# Patient Record
Sex: Female | Born: 1985 | Hispanic: Yes | Marital: Married
Health system: Southern US, Community
[De-identification: ages and names within clinical notes are randomized; demographics above are authoritative.]

---

## 2007-04-19 ENCOUNTER — Ambulatory Visit: Payer: Self-pay | Admitting: Family Medicine

## 2007-06-03 ENCOUNTER — Ambulatory Visit: Payer: Self-pay | Admitting: Certified Nurse Midwife

## 2007-10-23 ENCOUNTER — Observation Stay: Payer: Self-pay

## 2007-10-26 ENCOUNTER — Observation Stay: Payer: Self-pay | Admitting: Certified Nurse Midwife

## 2007-10-26 ENCOUNTER — Inpatient Hospital Stay: Payer: Self-pay | Admitting: Certified Nurse Midwife

## 2008-09-02 ENCOUNTER — Ambulatory Visit: Payer: Self-pay | Admitting: Certified Nurse Midwife

## 2008-11-02 ENCOUNTER — Encounter: Payer: Self-pay | Admitting: Maternal & Fetal Medicine

## 2009-03-09 ENCOUNTER — Observation Stay: Payer: Self-pay | Admitting: Obstetrics and Gynecology

## 2009-03-13 ENCOUNTER — Inpatient Hospital Stay: Payer: Self-pay | Admitting: Obstetrics and Gynecology

## 2009-06-17 IMAGING — US US OB US >=[ID] SNGL FETUS
1 series · 17 of 28 positions shown · non-contrast
Comparison: none

REASON FOR EXAM: dates and antamony
COMMENTS:

[Series 1: us ob us >=(id) sngl fetus · 17 of 80 slices shown]
[im 1/80]
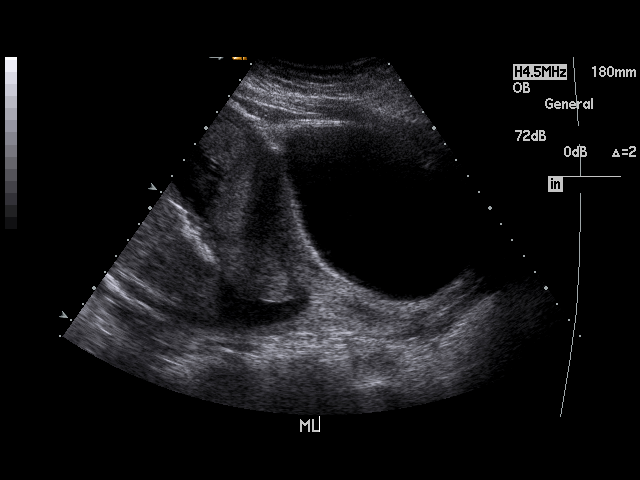
[im 6/80]
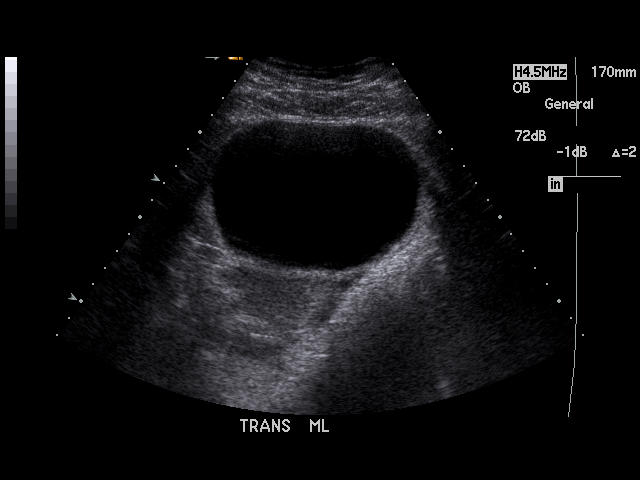
[im 12/80]
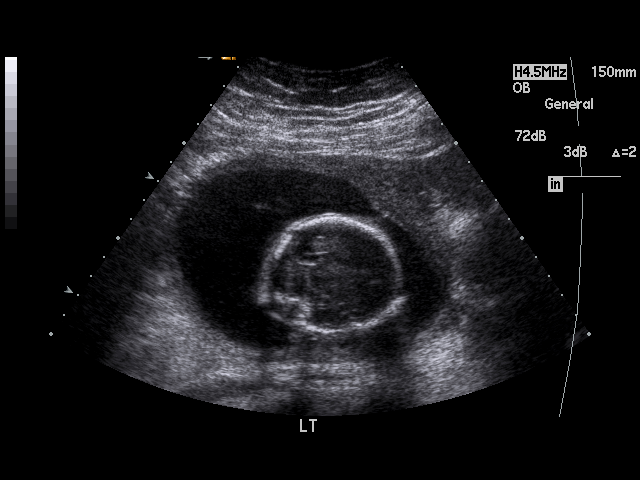
[im 15/80]
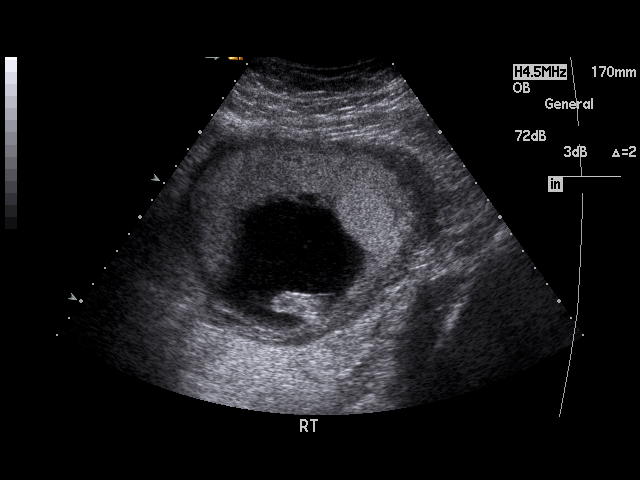
[im 21/80]
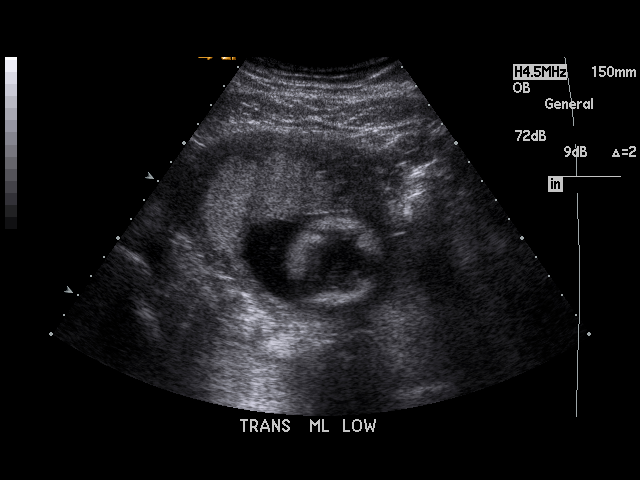
[im 27/80]
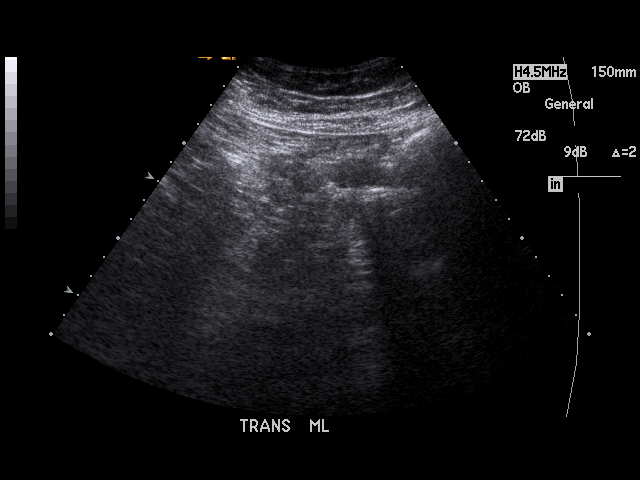
[im 30/80]
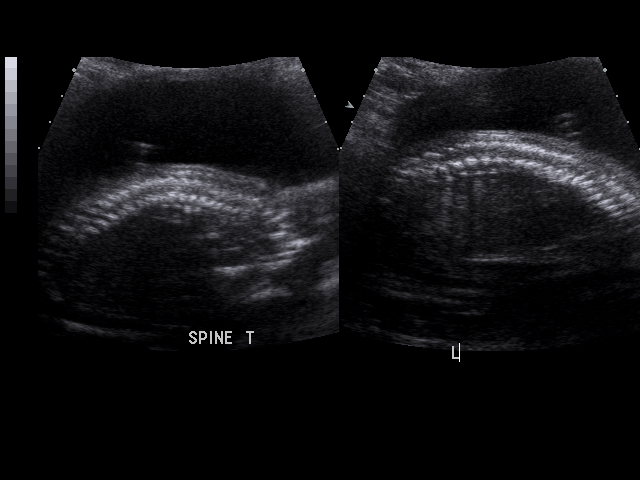
[im 36/80]
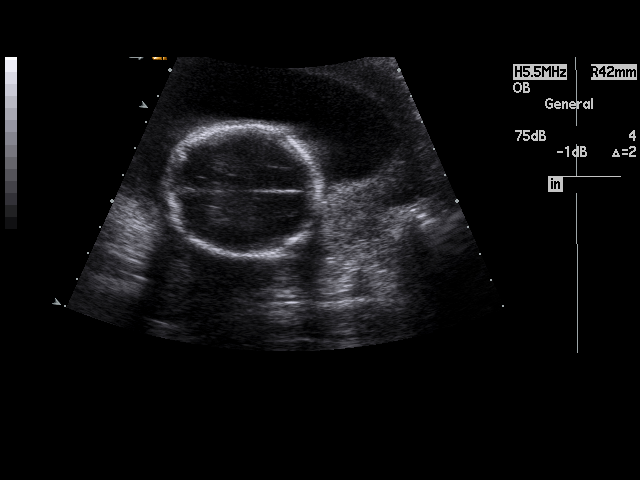
[im 41/80]
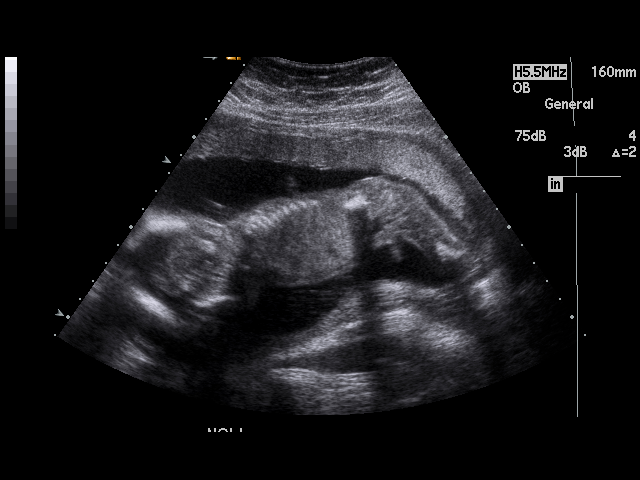
[im 44/80]
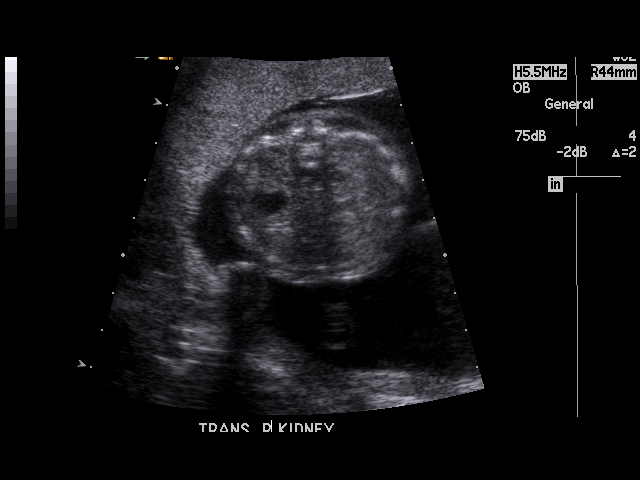
[im 50/80]
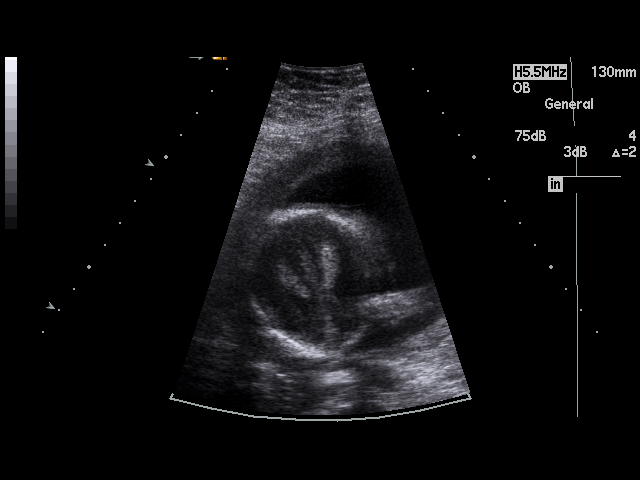
[im 53/80]
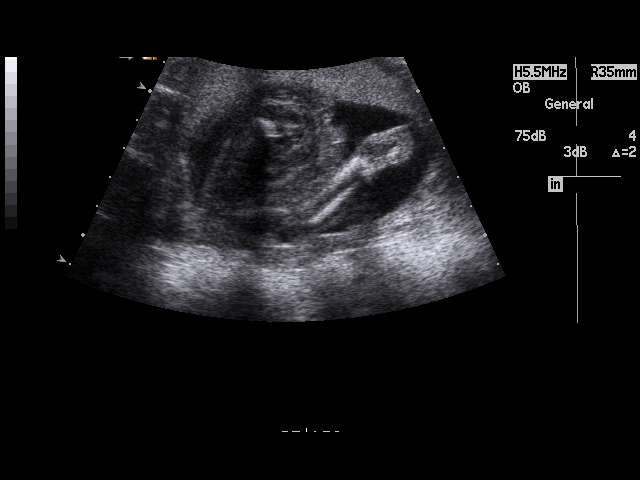
[im 59/80]
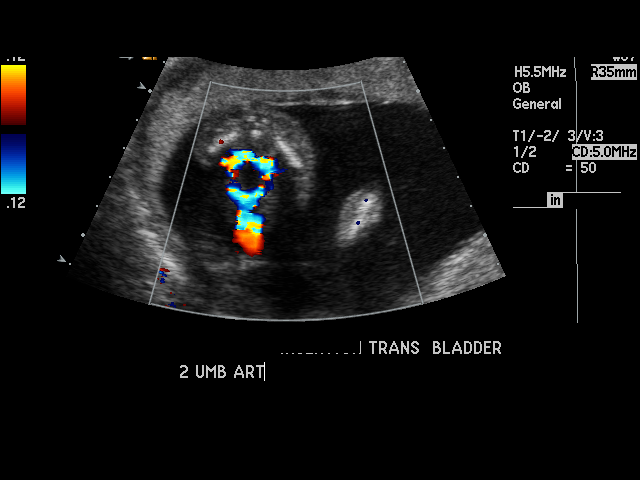
[im 65/80]
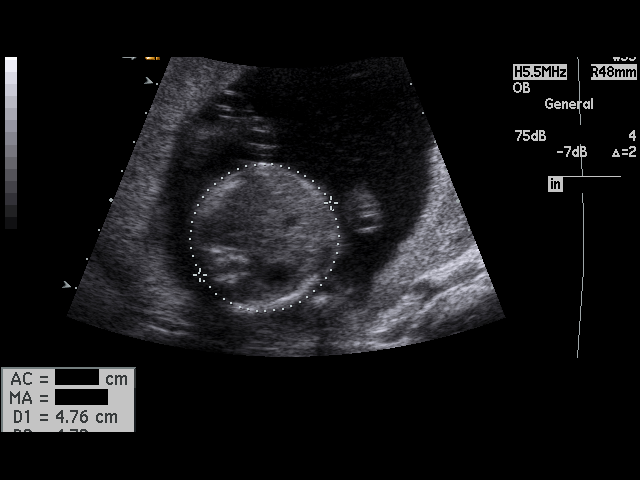
[im 68/80]
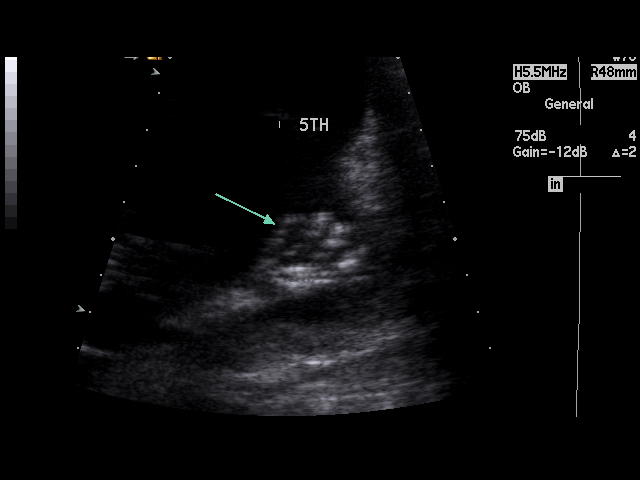
[im 74/80]
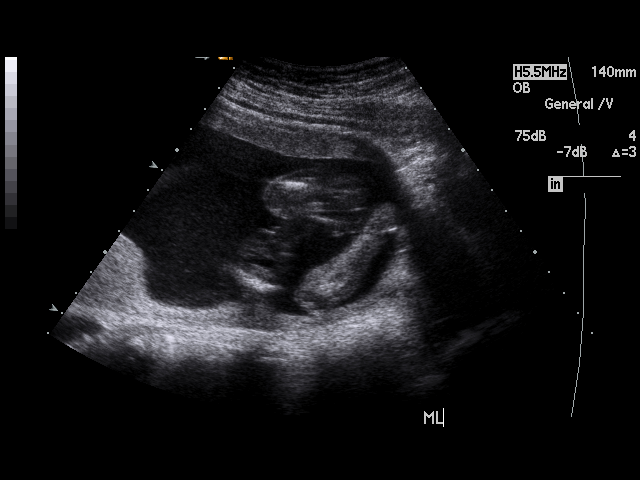
[im 80/80]
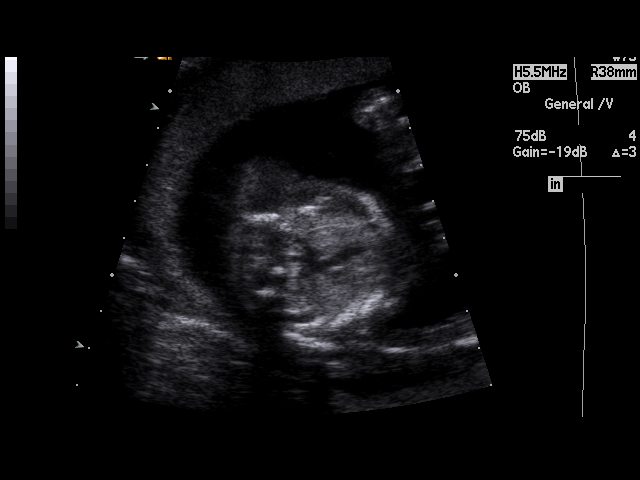

[17 of 28 positions shown; findings below may reference images not displayed]

PROCEDURE:     US  - US OB GREATER/OR EQUAL TO 30KMT  - June 03, 2007  [DATE]

RESULT:     There is observed a single living intrauterine gestation.
Presentation is variable during the course of this exam. The placenta is
anterior. The amniotic fluid volume appears normal. Fetal heart, stomach,
and urinary bladder are visualized. No hydronephrosis or hydrocephalus is
seen. Fetal measurements are as follows:

BPD      4.69 cm      20 weeks 1 day
HC           17.2 cm      19 weeks 5 days
AC         14.88 cm      20 weeks 1 day
FL              3.23 cm      20 weeks 0 days
HL           2.99 cm      19 weeks 6 days

EFW equals 356 grams. Averaged ultrasound age 20 weeks 0. Ultrasound EDD is
October 21, 2007.

Fetal heart rate was monitored at 160 beats per minute.
IMPRESSION: Please see above.

## 2010-03-08 ENCOUNTER — Ambulatory Visit: Payer: Self-pay | Admitting: Family Medicine

## 2010-08-02 ENCOUNTER — Inpatient Hospital Stay: Payer: Self-pay | Admitting: Obstetrics and Gynecology

## 2010-11-17 IMAGING — US US OB DETAIL+14 WK - NRPT [facility]
1 series · 14 of 28 positions shown · non-contrast
Comparison: none

[Series 1: us ob detail+14 wk - nrpt [facility] · 14 of 67 slices shown]
[im 3/67]
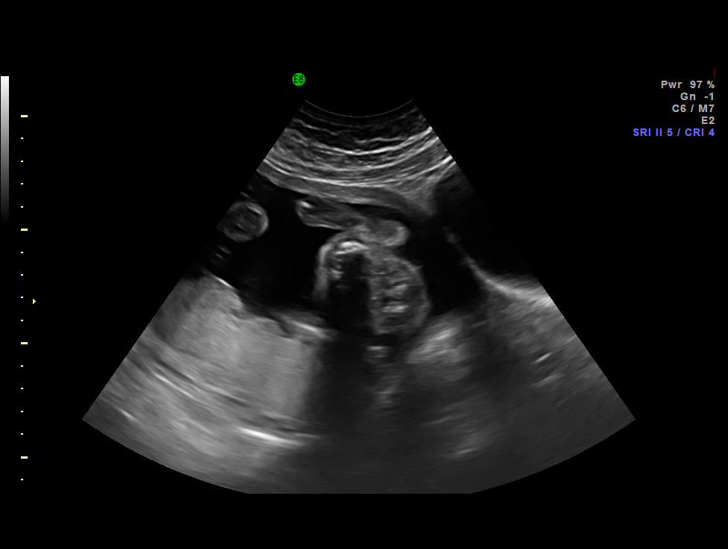
[im 8/67]
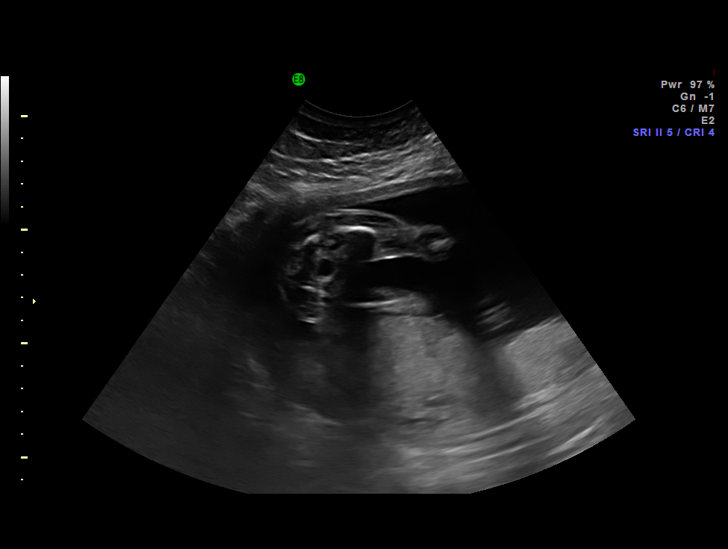
[im 13/67]
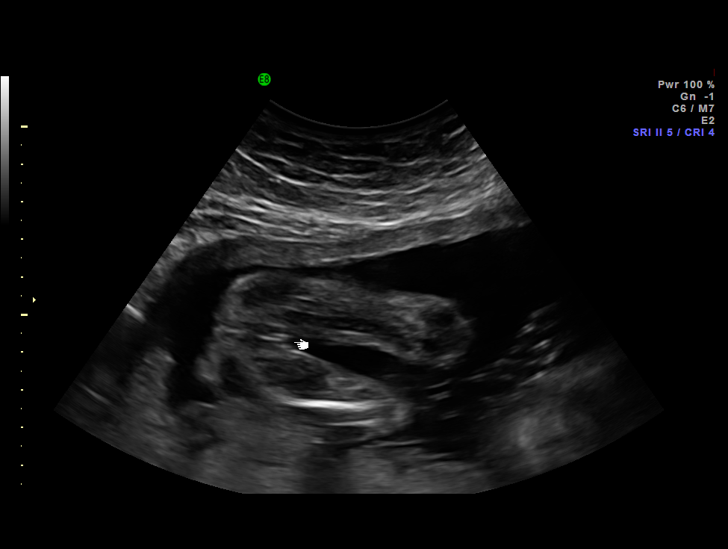
[im 18/67]
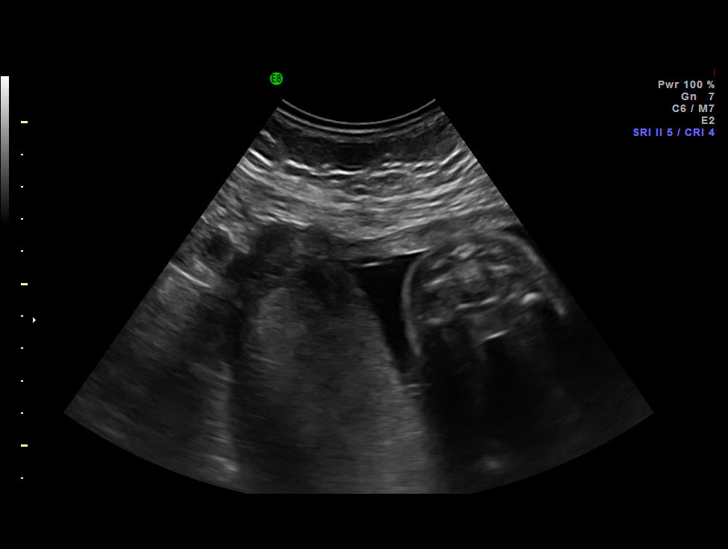
[im 23/67]
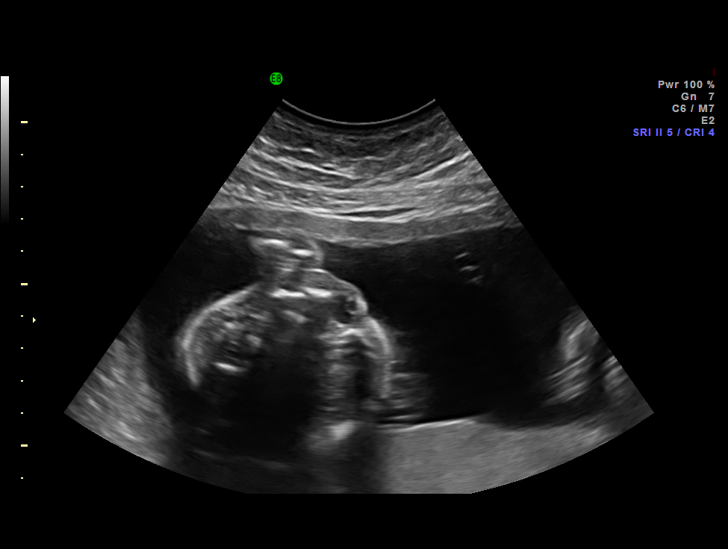
[im 27/67]
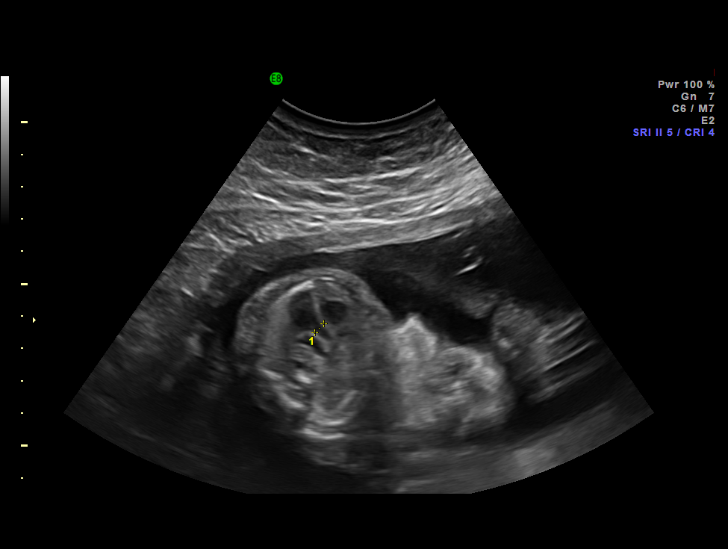
[im 32/67]
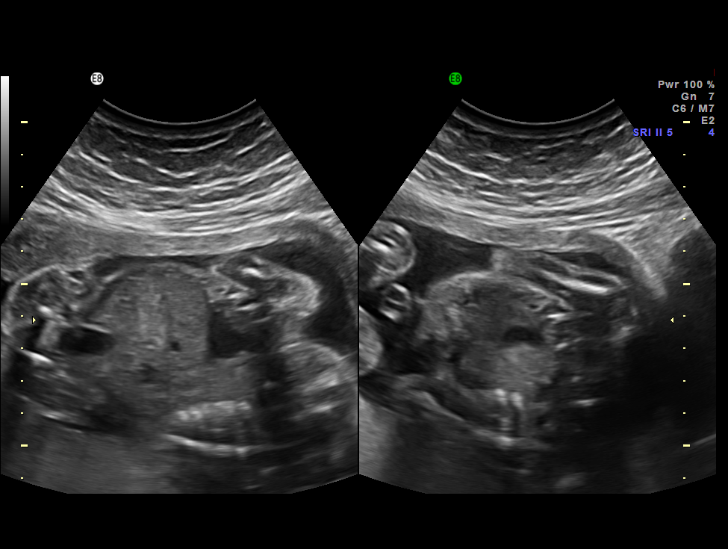
[im 37/67]
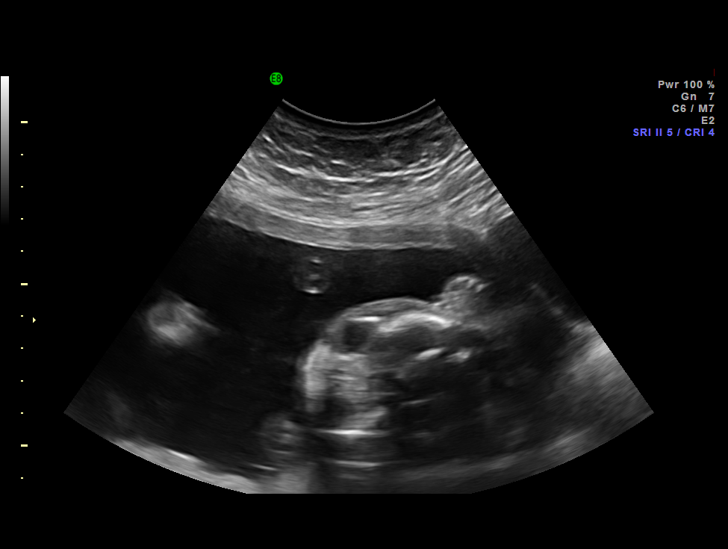
[im 42/67]
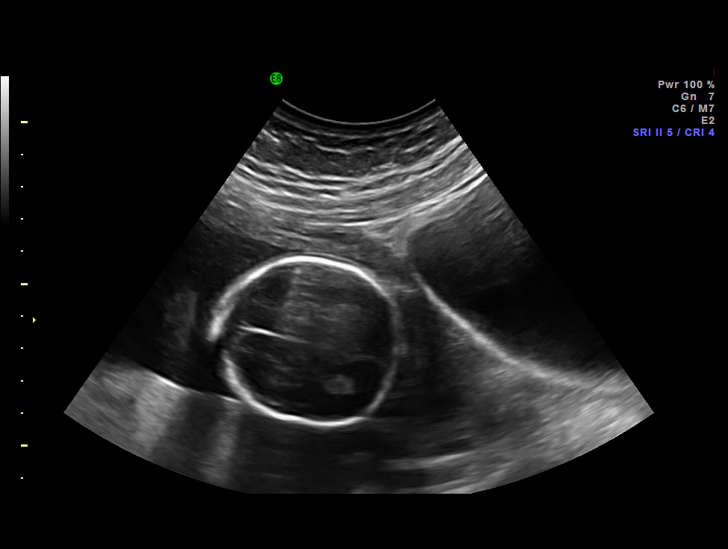
[im 47/67]
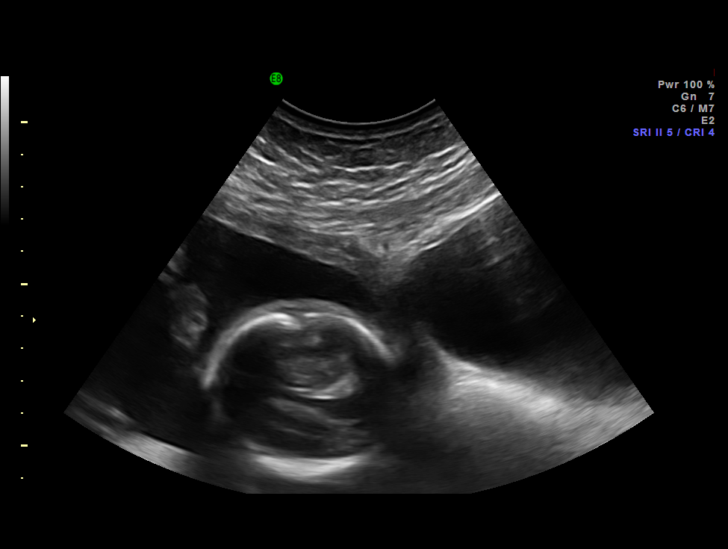
[im 52/67]
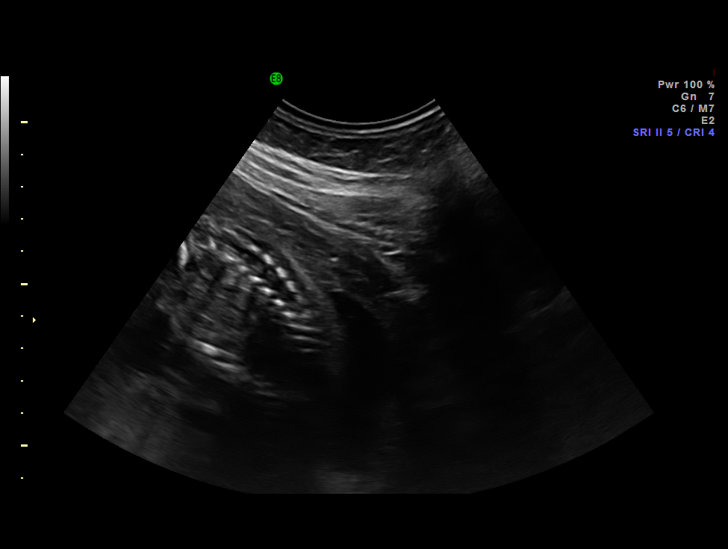
[im 57/67]
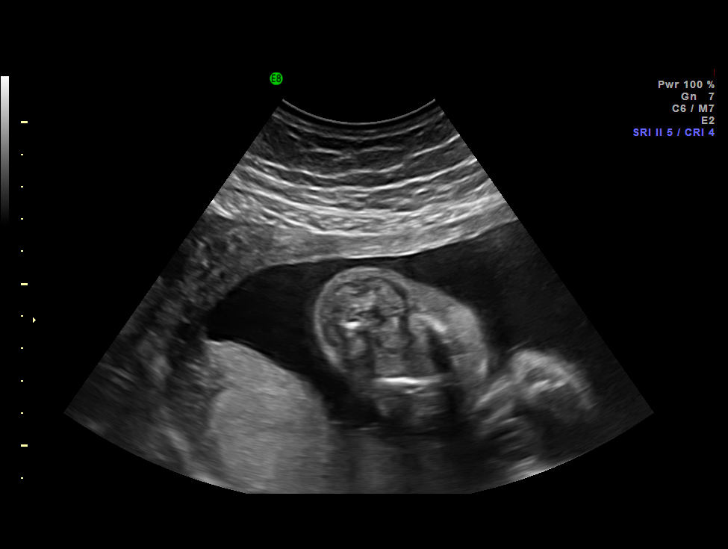
[im 62/67]
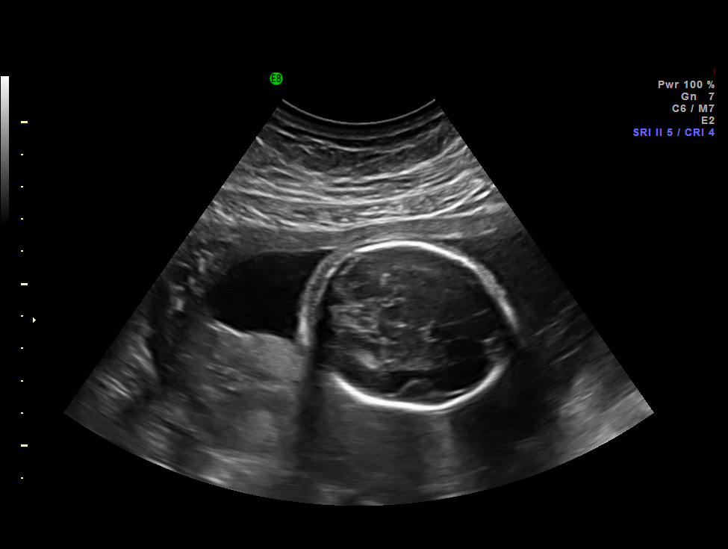
[im 67/67]
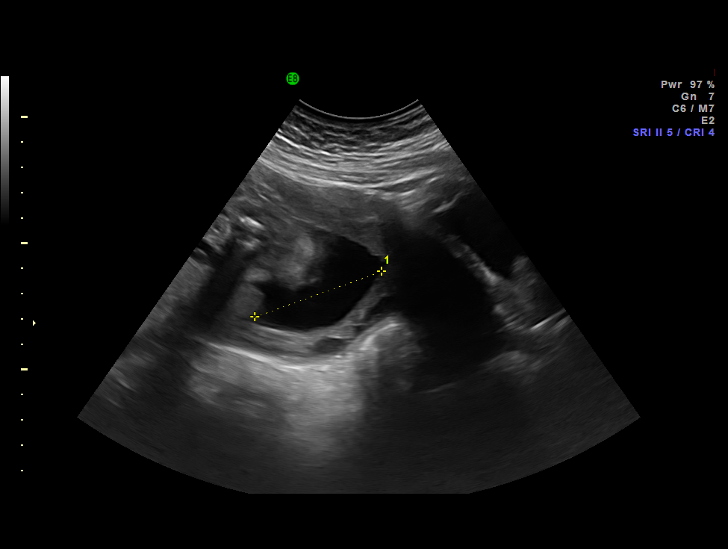

[14 of 28 positions shown; findings below may reference images not displayed]

IMAGES IMPORTED FROM THE [employer] WORKFLOW SYSTEM
NO DICTATION FOR STUDY

## 2011-12-18 ENCOUNTER — Ambulatory Visit: Payer: Self-pay | Admitting: Family Medicine

## 2012-05-12 ENCOUNTER — Inpatient Hospital Stay: Payer: Self-pay | Admitting: Obstetrics and Gynecology

## 2012-05-12 LAB — CBC WITH DIFFERENTIAL/PLATELET
Basophil #: 0 10*3/uL (ref 0.0–0.1)
Basophil %: 0.4 %
Eosinophil #: 0.2 10*3/uL (ref 0.0–0.7)
HCT: 36.4 % (ref 35.0–47.0)
Lymphocyte #: 3.1 10*3/uL (ref 1.0–3.6)
MCH: 29.8 pg (ref 26.0–34.0)
MCHC: 34 g/dL (ref 32.0–36.0)
MCV: 88 fL (ref 80–100)
Monocyte #: 0.8 x10 3/mm (ref 0.2–0.9)
Monocyte %: 6.7 %
Neutrophil #: 8 10*3/uL — ABNORMAL HIGH (ref 1.4–6.5)
Platelet: 217 10*3/uL (ref 150–440)
RDW: 15.9 % — ABNORMAL HIGH (ref 11.5–14.5)
WBC: 12.1 10*3/uL — ABNORMAL HIGH (ref 3.6–11.0)

## 2012-05-13 LAB — HEMOGLOBIN: HGB: 11.5 g/dL — ABNORMAL LOW (ref 12.0–16.0)

## 2012-06-13 ENCOUNTER — Ambulatory Visit: Payer: Self-pay | Admitting: Obstetrics and Gynecology

## 2012-06-13 LAB — URINALYSIS, COMPLETE
Bilirubin,UR: NEGATIVE
Ketone: NEGATIVE
Leukocyte Esterase: NEGATIVE
Nitrite: NEGATIVE
Ph: 6 (ref 4.5–8.0)
RBC,UR: 1 /HPF (ref 0–5)
Squamous Epithelial: 1

## 2012-06-13 LAB — HEMOGLOBIN: HGB: 13 g/dL (ref 12.0–16.0)

## 2012-06-14 ENCOUNTER — Ambulatory Visit: Payer: Self-pay | Admitting: Obstetrics and Gynecology

## 2014-01-01 IMAGING — US US OB US >=[ID] SNGL FETUS
1 series · 14 of 28 positions shown · non-contrast
Comparison: none

REASON FOR EXAM: anatomy
COMMENTS:

[Series 1: us ob us >=(id) sngl fetus · 0.26mm/px · 14 of 120 slices shown]
[im 5/120]
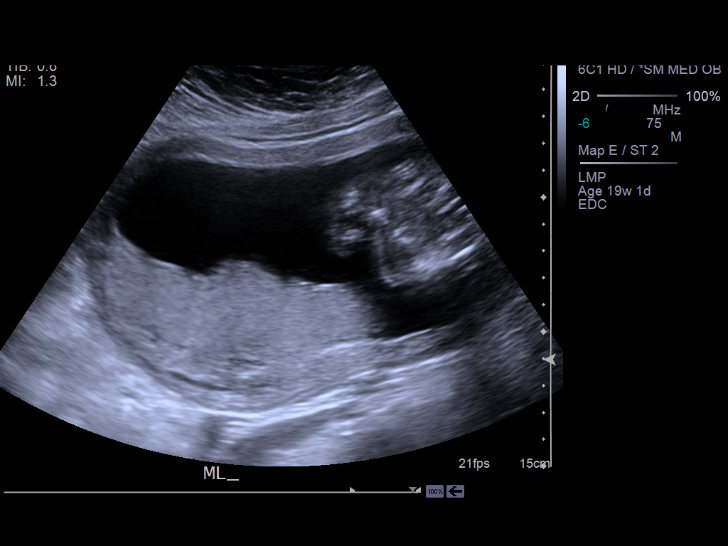
[im 14/120]
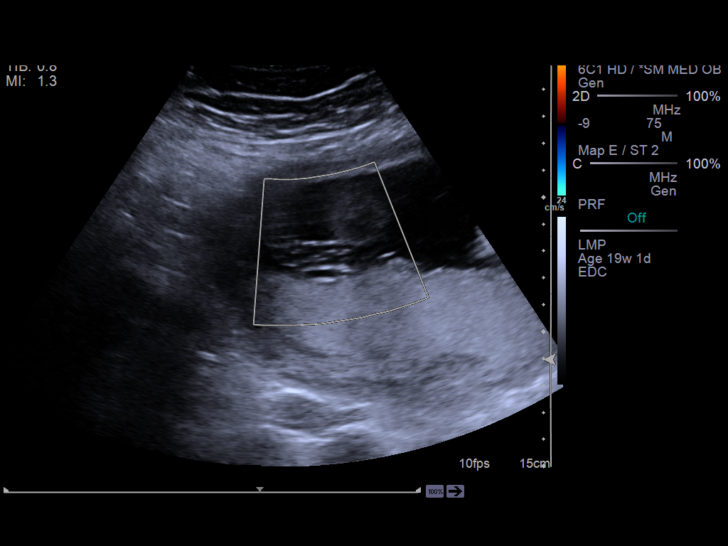
[im 23/120]
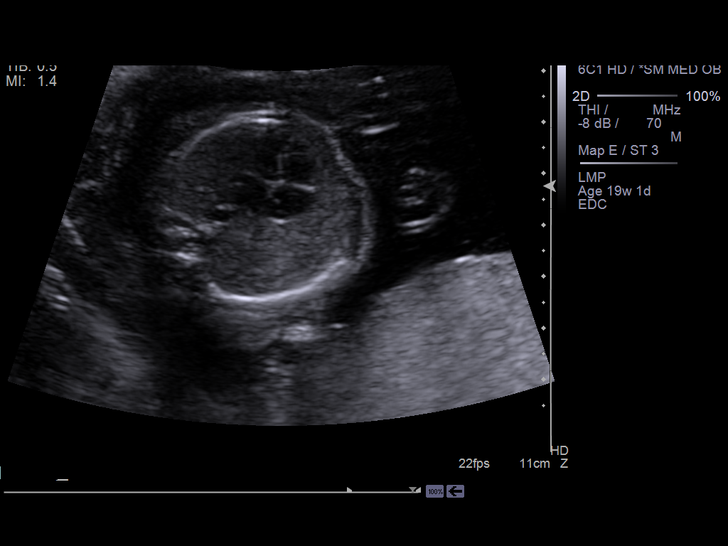
[im 31/120]
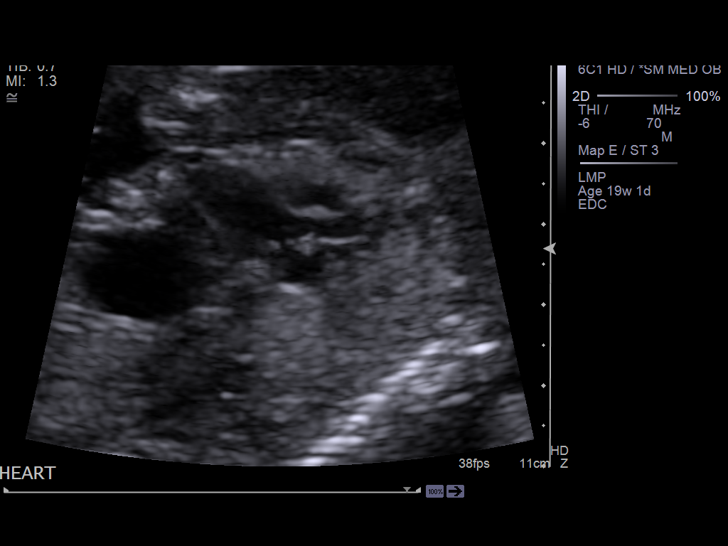
[im 40/120]
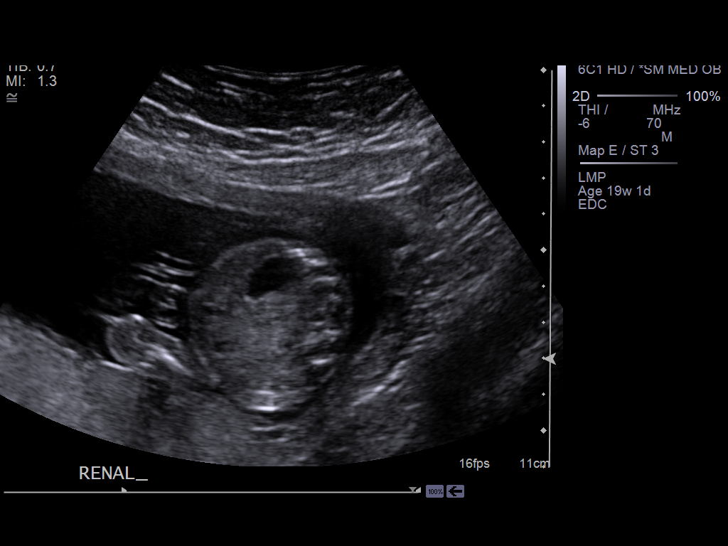
[im 49/120]
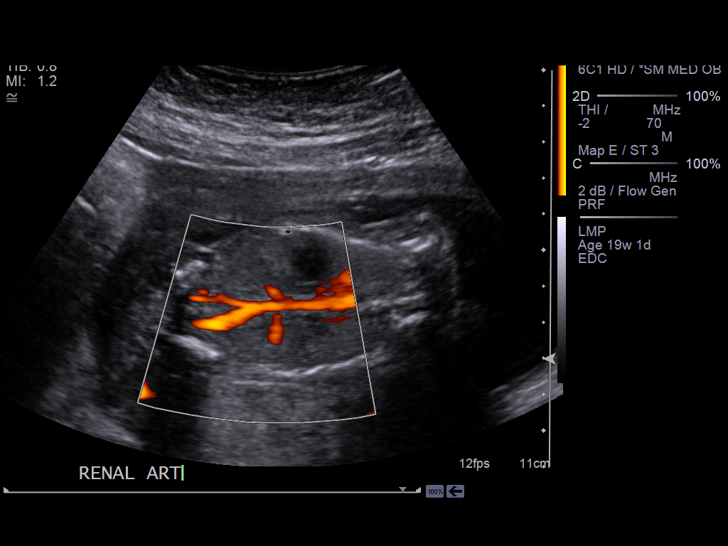
[im 58/120]
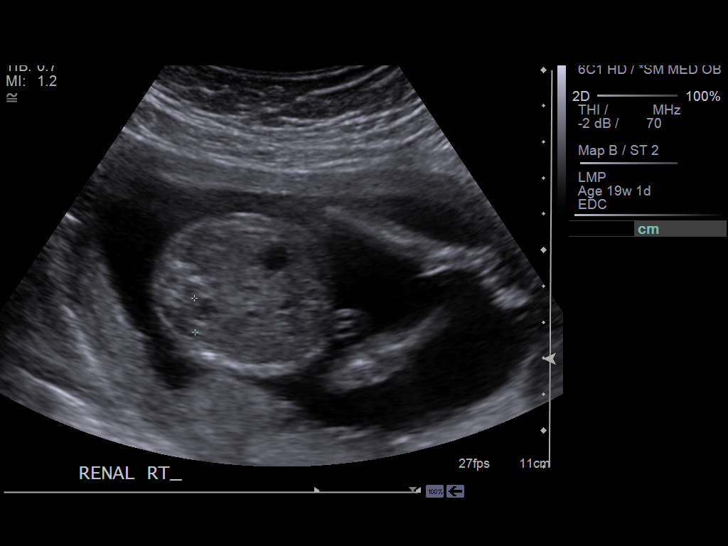
[im 67/120]
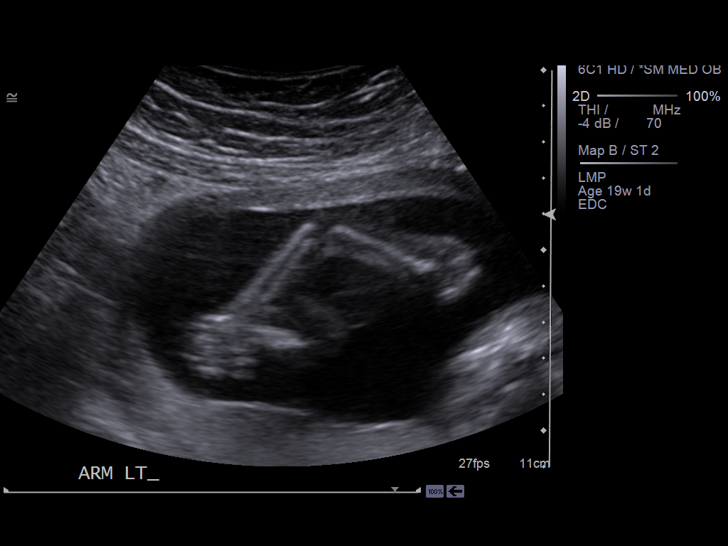
[im 75/120]
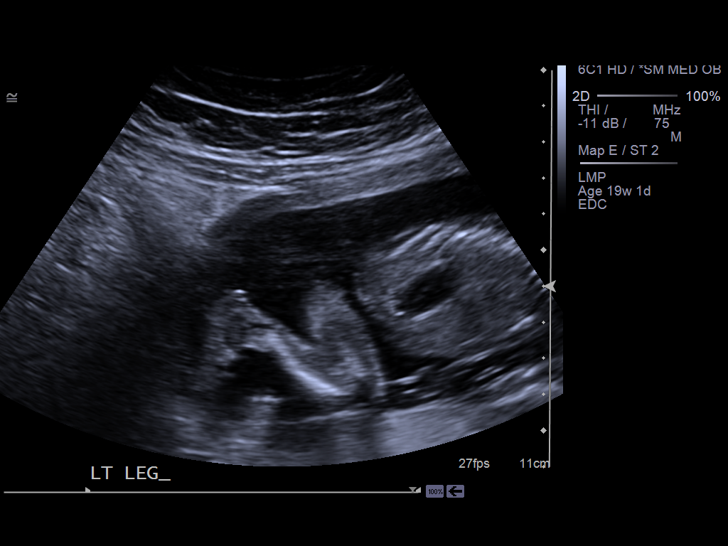
[im 84/120]
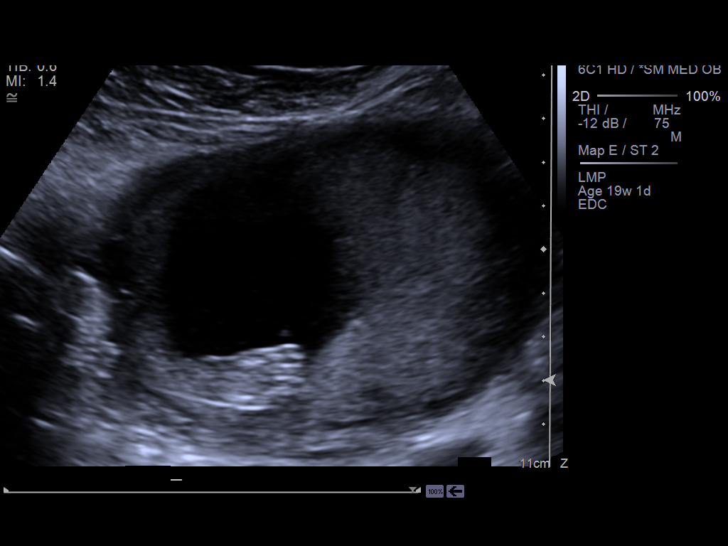
[im 93/120]
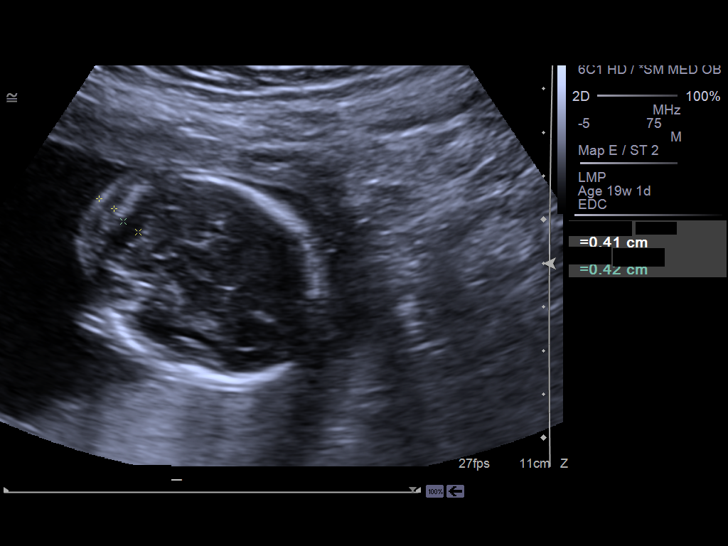
[im 102/120]
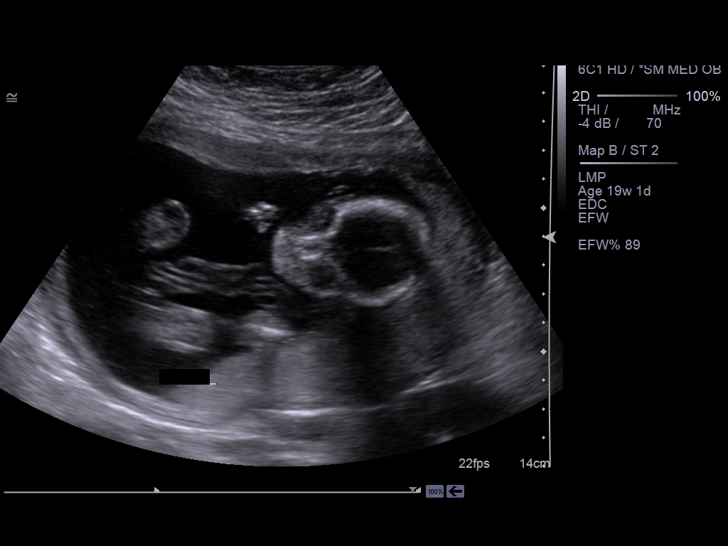
[im 111/120]
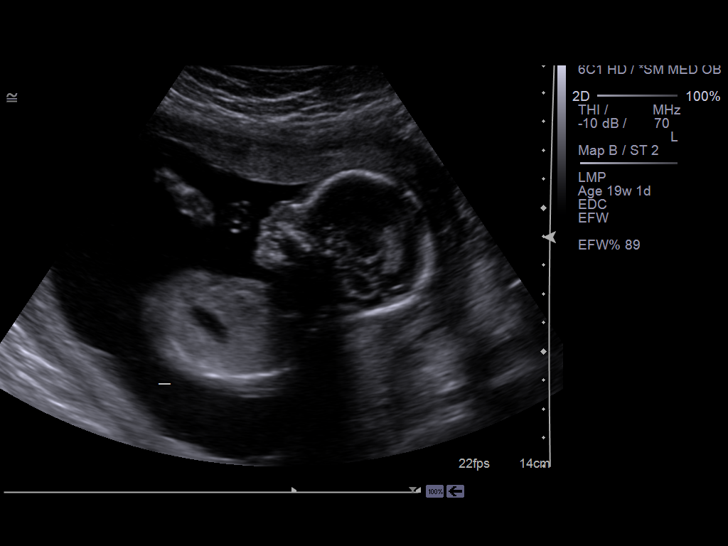
[im 120/120]
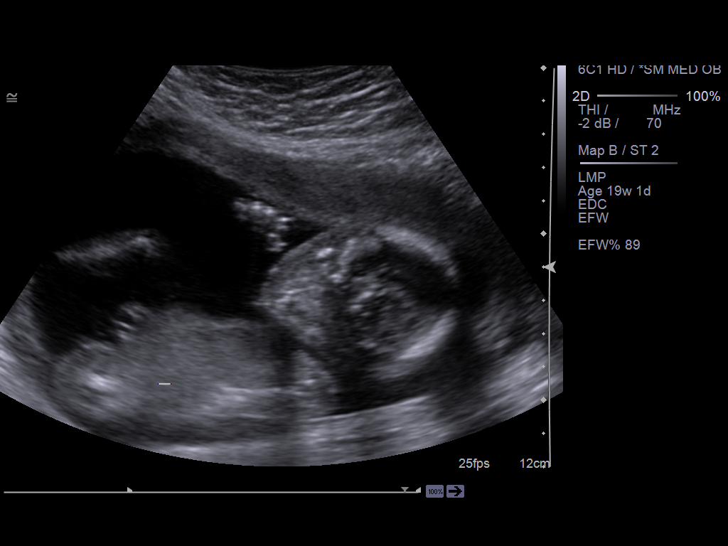

[14 of 28 positions shown; findings below may reference images not displayed]

PROCEDURE:     US  - US OB GREATER/OR EQUAL TO EIRR8  - December 18, 2011  [DATE]

RESULT:     Single viable intrauterine pregnancy is present with active
cardiac, trunk and extremity motion. The placenta is posterior. Fetal head,
spine, chest, abdomen and pelvis are unremarkable. No definite fetal
abnormality identified. Fetal heart of 169 beats per minute is noted.

Mean BPD of 4.5 cm, corresponding to 19 weeks, 4 days; head circumference of
16.3 cm, corresponding to 19 weeks, 0 days; abdominal circumference of
cm, corresponding to 20 weeks, 0 days, and femur length of 3.3 cm,
corresponding to 20 weeks, 1 day noted. Mean gestational age by ultrasound
is 19 weeks, 5 days. If there is clinical concern for fetal anomaly or if
the patient has factors suggesting a high risk pregnancy, ultrasound at a
tertiary care center or high risk center is suggested.
IMPRESSION: Single viable intrauterine pregnancy at 19 weeks, 5 days.

## 2014-10-13 NOTE — Op Note (Signed)
PATIENT NAME:  Christine Brandt, [place] MR#:  [id number] DATE OF BIRTH:  [birth year]  DATE OF PROCEDURE:  06/14/2012  PREOPERATIVE DIAGNOSIS: Desires permanent sterilization, declines less permanent means.   POSTOPERATIVE DIAGNOSIS: Desires permanent sterilization, declines less permanent means.  PROCEDURE: Laparoscopic tubal cautery.   SURGEON: Ricky L. Logan BoresEvans, MD   ANESTHESIA: General endotracheal.   ANESTHESIOLOGIST: Dr. Darleene CleaverVan Staveren   FINDINGS: Grossly normal abdomen, uterus, tubes, ovaries.   ESTIMATED BLOOD LOSS: Minimal.   COMPLICATIONS: None.   SPECIMENS: None.   DRAINS: In and out catheterization with a red rubber at the beginning of the case, approximately 250 mL.   ANTIBIOTICS: Two  grams of Ancef given IV preoperatively.   PROCEDURE IN DETAIL: The patient was consented. Preoperative antibiotics were given. The patient was taken to the Operating Room and placed in supine position where anesthesia was initiated. She was then placed in the dorsal lithotomy position using Allen stirrups, prepped and draped in the usual sterile fashion. The cervix was visualized. A Hulka tenaculum was placed. Next we turned our attention to the abdomen. A vertical 1 cm infraumbilical incision was made after infusion of 10 mL of 0.5% Sensorcaine. Pneumoperitoneum was established. The patient was placed in the Trendelenburg position with findings as noted above. We then proceeded with laparoscopic tubal cautery in the mid ampullary region of bilateral fallopian tubes in the usual fashion. Pressure was lowered to 6 mmHg. Areas were seen to be hemostatic. Pneumoperitoneum was allowed to resolve. The patient was taken out of Trendelenburg and the instruments removed. The incision was closed with deep of 0 Vicryl. A Band-Aid was applied.   All instrument, needle, and sponge counts were correct. The patient tolerated the procedure well. I anticipate a routine postoperative course.   ____________________________ Reatha Harpsicky L. Logan BoresEvans, MD rle:cb D: 06/14/2012 09:04:40 ET T: 06/14/2012 13:51:18 ET JOB#: [id number]  cc: Ricky L. Logan BoresEvans, MD, <Dictator> Augustina MoodICK L Brenly Trawick MD ELECTRONICALLY SIGNED 06/15/2012 16:20

## 2022-11-29 ENCOUNTER — Ambulatory Visit: Payer: 59

## 2022-11-29 ENCOUNTER — Ambulatory Visit (LOCAL_COMMUNITY_HEALTH_CENTER): Payer: 59

## 2022-11-29 DIAGNOSIS — Z0184 Encounter for antibody response examination: Secondary | ICD-10-CM

## 2022-11-29 DIAGNOSIS — Z23 Encounter for immunization: Secondary | ICD-10-CM

## 2022-11-29 DIAGNOSIS — Z719 Counseling, unspecified: Secondary | ICD-10-CM

## 2022-11-29 NOTE — Progress Notes (Signed)
Pt seen in clinic for immunization needs for college. Per [facility], pt needs Tdap. Pt states she had chicken pox when she was 37 y.o. Pt agreed for Tdap and Varicella titer. Pt signed ROI, requested for staff to call her for titer results. Administered vaccine, tolerated well. Given V[facility] and [facility] copies, explained and understood. M.Desi Carby, LPN.

## 2022-11-30 LAB — VARICELLA ZOSTER ANTIBODY, IGG: Varicella zoster IgG: 1263 index (ref >165)

## 2022-12-04 ENCOUNTER — Telehealth: Payer: Self-pay

## 2022-12-04 NOTE — Telephone Encounter (Signed)
Phone call to patient to inform her that varicella titer results show immunity and no varicella vaccine is indicated. Pt plans to pick up result copy and updated [facility] copy. These were placed at [facility] info booth for patient to pick up between Monday-Friday 8 am-5pm. Questions answered and reports understanding. Jerel Shepherd, RN
